# Patient Record
Sex: Male | Born: 2000 | Race: White | Hispanic: No | Marital: Single | Smoking: Never smoker
Health system: Southern US, Community
[De-identification: ages and names within clinical notes are randomized; demographics above are authoritative.]

## PROBLEM LIST (undated history)

## (undated) DIAGNOSIS — K5900 Constipation, unspecified: Secondary | ICD-10-CM

---
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
## 2021-02-18 DIAGNOSIS — R000 Tachycardia, unspecified: Secondary | ICD-10-CM | POA: Diagnosis not present

## 2023-07-08 ENCOUNTER — Other Ambulatory Visit (HOSPITAL_COMMUNITY): Payer: Self-pay
